# Patient Record
Sex: Female | Born: 1990 | Race: White | Hispanic: No | Marital: Married | Smoking: Current every day smoker
Health system: Southern US, Community
[De-identification: ages and names within clinical notes are randomized; demographics above are authoritative.]

## PROBLEM LIST (undated history)

## (undated) ENCOUNTER — Inpatient Hospital Stay: Payer: Self-pay

## (undated) DIAGNOSIS — D649 Anemia, unspecified: Secondary | ICD-10-CM

## (undated) DIAGNOSIS — F319 Bipolar disorder, unspecified: Secondary | ICD-10-CM

---

## 2014-11-22 NOTE — L&D Delivery Note (Signed)
Delivery Note At 9:01 PM a viable female was delivered via Vaginal, Spontaneous Delivery (Presentation: Left Occiput Anterior).  APGAR: Pending; weight Pending.   Placenta status: Intact, Spontaneous.  Cord: 3 vessels with the following complications: None  Anesthesia: Epidural  Episiotomy:  none Lacerations:  none Suture Repair: n/a Est. Blood Loss (mL):  250 mL  Mom to postpartum.  Baby to Couplet care / Skin to Skin.  Conard NovakJackson, Caide Campi D, MD 09/24/2015, 9:25 PM

## 2015-05-02 LAB — OB RESULTS CONSOLE RUBELLA ANTIBODY, IGM: Rubella: IMMUNE

## 2015-05-02 LAB — OB RESULTS CONSOLE RPR: RPR: NONREACTIVE

## 2015-05-02 LAB — OB RESULTS CONSOLE GC/CHLAMYDIA: Gonorrhea: NEGATIVE

## 2015-05-02 LAB — OB RESULTS CONSOLE HIV ANTIBODY (ROUTINE TESTING): HIV: NONREACTIVE

## 2015-05-02 LAB — OB RESULTS CONSOLE ABO/RH: RH TYPE: NEGATIVE

## 2015-05-02 LAB — OB RESULTS CONSOLE HEPATITIS B SURFACE ANTIGEN: HEP B S AG: NEGATIVE

## 2015-05-02 LAB — OB RESULTS CONSOLE VARICELLA ZOSTER ANTIBODY, IGG: VARICELLA IGG: IMMUNE

## 2015-08-22 LAB — OB RESULTS CONSOLE GC/CHLAMYDIA
Chlamydia: NEGATIVE
GC PROBE AMP, GENITAL: NEGATIVE

## 2015-08-22 LAB — OB RESULTS CONSOLE GBS: STREP GROUP B AG: NEGATIVE

## 2015-09-04 ENCOUNTER — Observation Stay
Admission: EM | Admit: 2015-09-04 | Discharge: 2015-09-04 | Disposition: A | Discharge status: Home / Self Care | Payer: Medicaid Other | Attending: Obstetrics & Gynecology | Admitting: Obstetrics & Gynecology

## 2015-09-04 ENCOUNTER — Encounter: Payer: Self-pay | Admitting: Obstetrics & Gynecology

## 2015-09-04 DIAGNOSIS — R32 Unspecified urinary incontinence: Secondary | ICD-10-CM | POA: Diagnosis not present

## 2015-09-04 DIAGNOSIS — O479 False labor, unspecified: Secondary | ICD-10-CM | POA: Diagnosis present

## 2015-09-04 DIAGNOSIS — O26893 Other specified pregnancy related conditions, third trimester: Secondary | ICD-10-CM | POA: Diagnosis not present

## 2015-09-04 DIAGNOSIS — N898 Other specified noninflammatory disorders of vagina: Secondary | ICD-10-CM | POA: Insufficient documentation

## 2015-09-04 MED ORDER — ACETAMINOPHEN 325 MG PO TABS: 650.0000 mg | ORAL_TABLET | ORAL | Status: DC | PRN

## 2015-09-04 MED ORDER — ONDANSETRON HCL 4 MG/2ML IJ SOLN: 4.0000 mg | Freq: Four times a day (QID) | INTRAMUSCULAR | Status: DC | PRN

## 2015-09-04 NOTE — Discharge Summary (Signed)
Physician Final Progress Note  Patient ID: Nancy Blankenship MRN: [id number] DOB/AGE: 25/06/1991 23 y.o.  Admit date: 09/04/2015 Admitting provider: Velora MediateHARRIS, Chinelo Benn. Discharge date: 09/04/2015  Admission Diagnoses: Vaginal discharge, urinary incontinence, third trimester  Discharge Diagnoses:  Active Problems:   * No active hospital problems. *  Same  Consults: None  Significant Findings/ Diagnostic Studies: neg study for amniotic fluid  Procedures: NST Reactive  Discharge Condition: good  Disposition: Final discharge disposition not confirmed  Diet: Regular diet  Discharge Activity: Activity as tolerated   A NST procedure was performed with FHR monitoring and a normal baseline established, appropriate time of 20-40 minutes of evaluation, and accels >2 seen w 15x15 characteristics.  Results show a REACTIVE NST.   Total time spent taking care of this patient: 15 minutes plus 20+ minute NST Discharge exam- No fever.    Abd NT, ND, gravid    Extr no edema    SVE closed cervix, neg nitrazine and pooling    Toco: no ctxs    FHT- R NST  Signed: Mirissa Lopresti PAUL 09/04/2015, 3:55 PM

## 2015-09-04 NOTE — OB Triage Note (Signed)
Recvd to LDR3 per wheelchair with c/o leaking fluid.  Changed to gown and to bed. EFM applied.  Plan of care discussed.  ORiented to room .  Verbalized understanding.

## 2015-09-04 NOTE — Plan of Care (Signed)
Discharge instructions given and explained.  Verbalized understanding.  Signed copy in hand and one on chart.

## 2015-09-23 ENCOUNTER — Inpatient Hospital Stay
Admission: EM | Admit: 2015-09-23 | Discharge: 2015-09-25 | DRG: 775 | Disposition: A | Discharge status: Home / Self Care | Payer: Medicaid Other | Attending: Obstetrics and Gynecology | Admitting: Obstetrics and Gynecology

## 2015-09-23 DIAGNOSIS — Z3A41 41 weeks gestation of pregnancy: Secondary | ICD-10-CM | POA: Diagnosis not present

## 2015-09-23 DIAGNOSIS — F319 Bipolar disorder, unspecified: Secondary | ICD-10-CM | POA: Diagnosis present

## 2015-09-23 DIAGNOSIS — W19XXXA Unspecified fall, initial encounter: Secondary | ICD-10-CM | POA: Diagnosis present

## 2015-09-23 DIAGNOSIS — O99344 Other mental disorders complicating childbirth: Principal | ICD-10-CM | POA: Diagnosis present

## 2015-09-23 DIAGNOSIS — Y92231 Patient bathroom in hospital as the place of occurrence of the external cause: Secondary | ICD-10-CM | POA: Diagnosis present

## 2015-09-23 DIAGNOSIS — Z8279 Family history of other congenital malformations, deformations and chromosomal abnormalities: Secondary | ICD-10-CM

## 2015-09-23 DIAGNOSIS — R55 Syncope and collapse: Secondary | ICD-10-CM | POA: Diagnosis present

## 2015-09-23 DIAGNOSIS — O480 Post-term pregnancy: Secondary | ICD-10-CM | POA: Diagnosis present

## 2015-09-23 DIAGNOSIS — F419 Anxiety disorder, unspecified: Secondary | ICD-10-CM | POA: Diagnosis present

## 2015-09-23 LAB — CBC
HCT: 33.5 % — ABNORMAL LOW (ref 35.0–47.0)
HEMOGLOBIN: 11.4 g/dL — AB (ref 12.0–16.0)
MCH: 29.1 pg (ref 26.0–34.0)
MCHC: 34 g/dL (ref 32.0–36.0)
MCV: 85.8 fL (ref 80.0–100.0)
Platelets: 167 10*3/uL (ref 150–440)
RBC: 3.91 MIL/uL (ref 3.80–5.20)
RDW: 15.2 % — ABNORMAL HIGH (ref 11.5–14.5)
WBC: 9.6 10*3/uL (ref 3.6–11.0)

## 2015-09-23 LAB — TYPE AND SCREEN
ABO/RH(D): O NEG
ANTIBODY SCREEN: NEGATIVE

## 2015-09-23 LAB — CHLAMYDIA/NGC RT PCR (ARMC ONLY)
CHLAMYDIA TR: NOT DETECTED
N gonorrhoeae: NOT DETECTED

## 2015-09-23 LAB — ABO/RH: ABO/RH(D): O NEG

## 2015-09-23 MED ORDER — MISOPROSTOL 25 MCG QUARTER TABLET
25.0000 ug | ORAL_TABLET | ORAL | Status: DC | PRN
  Filled 2015-09-23: qty 1

## 2015-09-23 MED ORDER — LIDOCAINE HCL (PF) 1 % IJ SOLN
INTRAMUSCULAR | Status: AC
  Filled 2015-09-23: qty 30

## 2015-09-23 MED ORDER — MISOPROSTOL 25 MCG QUARTER TABLET
25.0000 ug | ORAL_TABLET | ORAL | Status: DC | PRN
  Administered 2015-09-23 (×2): 25 ug via ORAL
  Filled 2015-09-23: qty 1

## 2015-09-23 MED ORDER — TERBUTALINE SULFATE 1 MG/ML IJ SOLN: 0.2500 mg | Freq: Once | INTRAMUSCULAR | Status: DC | PRN

## 2015-09-23 MED ORDER — LIDOCAINE HCL (PF) 1 % IJ SOLN
30.0000 mL | INTRAMUSCULAR | Status: DC | PRN
  Filled 2015-09-23: qty 30

## 2015-09-23 MED ORDER — OXYTOCIN 40 UNITS IN LACTATED RINGERS INFUSION - SIMPLE MED
62.5000 mL/h | INTRAVENOUS | Status: DC
  Administered 2015-09-24: 62.5 mL/h via INTRAVENOUS
  Filled 2015-09-23 (×2): qty 1000

## 2015-09-23 MED ORDER — LACTATED RINGERS IV SOLN
500.0000 mL | INTRAVENOUS | Status: DC | PRN
  Administered 2015-09-25: 1000 mL via INTRAVENOUS

## 2015-09-23 MED ORDER — BUTORPHANOL TARTRATE 1 MG/ML IJ SOLN
1.0000 mg | INTRAMUSCULAR | Status: DC | PRN
  Administered 2015-09-24 (×4): 1 mg via INTRAVENOUS
  Filled 2015-09-23 (×5): qty 1

## 2015-09-23 MED ORDER — MISOPROSTOL 200 MCG PO TABS
ORAL_TABLET | ORAL | Status: AC
  Administered 2015-09-23: 25 ug via ORAL
  Filled 2015-09-23: qty 4

## 2015-09-23 MED ORDER — CITRIC ACID-SODIUM CITRATE 334-500 MG/5ML PO SOLN
30.0000 mL | ORAL | Status: DC | PRN
  Filled 2015-09-23: qty 30

## 2015-09-23 MED ORDER — OXYTOCIN 10 UNIT/ML IJ SOLN
INTRAMUSCULAR | Status: AC
  Filled 2015-09-23: qty 2

## 2015-09-23 MED ORDER — ONDANSETRON HCL 4 MG/2ML IJ SOLN
4.0000 mg | Freq: Four times a day (QID) | INTRAMUSCULAR | Status: DC | PRN
  Administered 2015-09-24: 4 mg via INTRAVENOUS
  Filled 2015-09-23: qty 2

## 2015-09-23 MED ORDER — ACETAMINOPHEN 325 MG PO TABS: 650.0000 mg | ORAL_TABLET | ORAL | Status: DC | PRN

## 2015-09-23 MED ORDER — OXYTOCIN 40 UNITS IN LACTATED RINGERS INFUSION - SIMPLE MED
1.0000 m[IU]/min | INTRAVENOUS | Status: DC
  Administered 2015-09-23: 1 m[IU]/min via INTRAVENOUS

## 2015-09-23 MED ORDER — OXYTOCIN BOLUS FROM INFUSION
500.0000 mL | INTRAVENOUS | Status: DC
  Administered 2015-09-24: 500 mL via INTRAVENOUS

## 2015-09-23 MED ORDER — AMMONIA AROMATIC IN INHA
RESPIRATORY_TRACT | Status: DC
Start: 2015-09-23 — End: 2015-09-24
  Filled 2015-09-23: qty 10

## 2015-09-23 MED ORDER — LACTATED RINGERS IV SOLN
INTRAVENOUS | Status: DC
  Administered 2015-09-23: 20:00:00 via INTRAVENOUS

## 2015-09-23 NOTE — Plan of Care (Signed)
Second dose of cytotec 25 mcg given to pt orally. Pt sleeping soundly when nurse went into room. Explained to pt and pt 's husband reason for not checking cervix at present. ( very uncomfortable vaginal exam this am. Pt having occasional contractions).

## 2015-09-23 NOTE — H&P (Signed)
OB History & Physical   History of Present Illness:  Chief Complaint:   HPI:  Nancy Blankenship is a 24 y.o. G34P2002 female at 25.1 dated by LMP c/w 12w [place] with EDC of 09/16/15.  She presents to L&D for IOL due to past due date.  +FM, no CTX, no LOF, no VB  Pregnancy Issues: 1. Multiple moves this pregnancy, entry to care @ 20 weeks 2. Family history of Down Syndrome (niece) and Asperger Syndrome (Brother), patient declined [employer].  Genetic screening neg 3. History of Anxiety/Depression, Bipolar d/o. No meds this pregnancy 4. Rh neg, Anti-D pos @ screening but was s/p RhoGam @ 12 weeks and 28 weeks 5. Anemia  6. History of PPH with G2 7. Desires BTL  Maternal Medical History:  No past medical history on file.  No past surgical history on file.  No Known Allergies  Prior to Admission medications   Not on File     Prenatal care site: [facility]   Social History: She    Family History: family history is not on file.   Review of Systems: Negative x 10 systems reviewed except as noted in the HPI.     Physical Exam:  Vital Signs: BP 124/77 mmHg  Pulse 92  Temp(Src) 98 F (36.7 C)  General: no acute distress.  HEENT: normocephalic, atraumatic Heart: regular rate & rhythm.  No murmurs/rubs/gallops Lungs: clear to auscultation bilaterally, normal respiratory effort Abdomen: soft, gravid, non-tender;  EFW: 7.12 Pelvic:   External: Normal external female genitalia  Cervix: Dilation: 1 / Effacement (%): 20 / Station: -3    Extremities: non-tender, symmetric, 1+ edema bilaterally.  DTRs: 2+ Neurologic: Alert & oriented x 3.    No results found for this or any previous visit (from the past 24 hour(s)).  Pertinent Results:  Prenatal Labs: Blood type/Rh O neg  Antibody screen POS  (D) s/p Rhogam @ 12wks  Rubella Immune  Varicella Immune  RPR NR  HBsAg Neg  HIV NR  GC neg  Chlamydia neg  Genetic screening negative  1 hour GTT 143  3 hour GTT  74/162/135/111  GBS Neg 08/22/15   FHT:140 mod + accels no decels TOCO:  quiet SVE: 1/long/high   Cephalic by leopolds  Assessment:  Nancy Blankenship is a 24 y.o. G66P2002 female at 25.1 with IOL for past due date.   Plan:  1. Admit to Labor & Delivery 2. CBC, T&S, Clrs, IVF 3. GBS  neg 4. Consents obtained. 5. Continuous efm/toco 6. Start cervical ripening with cytotec buccally due to patient intolerance of pelvic exams 7. Have methergine, Hemabate, cytotec, and pitocin at bedside, with PPH kit.   8. No tubal consent on file, will discuss possible cost with patient and have her decide whether she wants this as inpatient now, or if in 6 weeks as outpatient.  TBD.  ----- Larey Days, MD Attending Obstetrician and Gynecologist [facility]

## 2015-09-23 NOTE — Progress Notes (Signed)
Pt turned to right side. Tracing maternal heart beat. Pt tearful. Pt talking to her mom that is taking care of her two sons.

## 2015-09-23 NOTE — Plan of Care (Signed)
Dr. Elesa MassedWard in to exam patient.  Cervix 3cm, posterior.  Difficult exam secondary to patient intolerance/discomfort.  Plan of care discussed with pt and husband regarding pain management plan, eating small meal/snack, then starting pitocin.  Patient and husband agreeable with plan.

## 2015-09-23 NOTE — Progress Notes (Signed)
Intrapartum progress note  S:  Patient comfortable, not really feeling contractions.  No complaints.  No HA SOB RUQ pain or vision changes O: BP 127/74 mmHg  Pulse 78  Temp(Src) 97.7 F (36.5 C) (Oral)  Resp 20    FHT:L 130 mod + accels no decels. Toco: q2-3 mins SVE: 3/70/-3 stripped membranes, cephalic -  Still very very uncomfortable.  A/P: 23yo [id number] @ 41.4 with IOL for past due date 1.  IUP: category 1 2. IOL: s/p 2 doses of buccal cytotec 25mcg.  Bishop now favorable will start pitocin.  Ok for patient to have small meal before this. 3. Dispo: continue active management, remain inpatient. 4. Recommend earlier epidural to facilitate cervical checks PRN.  ----- Ranae Plumberhelsea Xian Apostol, MD Attending Obstetrician and Gynecologist [facility]

## 2015-09-23 NOTE — Progress Notes (Signed)
Pt sitting up eating clear liquid.

## 2015-09-23 NOTE — Plan of Care (Signed)
Pt c/o sternal chest pain. Pulse ox monitor applied. Dr ward notified.

## 2015-09-24 ENCOUNTER — Inpatient Hospital Stay: Payer: Medicaid Other | Admitting: Anesthesiology

## 2015-09-24 ENCOUNTER — Encounter: Payer: Self-pay | Admitting: *Deleted

## 2015-09-24 LAB — RPR: RPR: NONREACTIVE

## 2015-09-24 LAB — PLATELET COUNT: PLATELETS: 166 10*3/uL (ref 150–440)

## 2015-09-24 MED ORDER — FENTANYL 2.5 MCG/ML W/ROPIVACAINE 0.2% IN NS 100 ML EPIDURAL INFUSION (ARMC-ANES)
EPIDURAL | Status: DC | PRN
  Administered 2015-09-24: 9 mL/h via EPIDURAL

## 2015-09-24 MED ORDER — LIDOCAINE-EPINEPHRINE (PF) 1.5 %-1:200000 IJ SOLN
INTRAMUSCULAR | Status: DC | PRN
  Administered 2015-09-24: 3 mL via EPIDURAL

## 2015-09-24 MED ORDER — IBUPROFEN 600 MG PO TABS
ORAL_TABLET | ORAL | Status: AC
  Filled 2015-09-24: qty 1

## 2015-09-24 MED ORDER — BUPIVACAINE HCL (PF) 0.25 % IJ SOLN
INTRAMUSCULAR | Status: DC | PRN
  Administered 2015-09-24: 5 mL via EPIDURAL
  Administered 2015-09-24: 3 mL via EPIDURAL

## 2015-09-24 MED ORDER — OXYCODONE-ACETAMINOPHEN 5-325 MG PO TABS
ORAL_TABLET | ORAL | Status: AC
  Filled 2015-09-24: qty 1

## 2015-09-24 MED ORDER — IBUPROFEN 600 MG PO TABS
600.0000 mg | ORAL_TABLET | Freq: Four times a day (QID) | ORAL | Status: DC
  Administered 2015-09-24 – 2015-09-25 (×4): 600 mg via ORAL
  Filled 2015-09-24 (×2): qty 1

## 2015-09-24 MED ORDER — FENTANYL 2.5 MCG/ML W/ROPIVACAINE 0.2% IN NS 100 ML EPIDURAL INFUSION (ARMC-ANES)
EPIDURAL | Status: AC
  Filled 2015-09-24: qty 100

## 2015-09-24 MED ORDER — OXYCODONE-ACETAMINOPHEN 5-325 MG PO TABS: 1.0000 | ORAL_TABLET | ORAL | Status: DC | PRN

## 2015-09-24 NOTE — Discharge Summary (Signed)
OB Discharge Summary  Patient Name: Nancy Blankenship DOB: [birth year] MRN: [id number]  Date of admission: 09/23/2015 Delivering MD: Thomasene Mohair, MD Date of Delivery: 09/24/2015  Date of discharge: 09/25/2015  Admitting diagnosis: 41 WEEKS INDUCTION  Intrauterine pregnancy: [redacted]w[redacted]d Secondary diagnosis:  Rh negative, bipolar, depression/anxiety     Discharge diagnosis: Same. Term Pregnancy Delivered                                                                                                Post partum procedures:none  Augmentation: AROM and Pitocin  Complications: None  Hospital course:  Induction of Labor With Vaginal Delivery   24 y.o. yo G3P3003 at [redacted]w[redacted]d was admitted to the hospital 09/23/2015 for induction of labor.  Indication for induction: Postdates.  Patient had an uncomplicated labor course as follows: Membrane Rupture Time/Date: 7:25 PM ,09/24/2015   Intrapartum Procedures: Episiotomy:   none                                        Lacerations:    none Patient had delivery of a Viable infant.  Information for the patient's newborn:  Nancy, Blankenship [[id number]]  Delivery Method: Vag-Spont    09/24/2015  Details of delivery can be found in separate delivery note.  Patient had syncopal event after delivery and H/H downtrended but no obvious VB or lochia with VV syncope as most likely etiology. Patient is discharged home on PPD#1 after desiring early d/c and meeting all PP goals.     Physical exam  Filed Vitals:   09/25/15 1312 09/25/15 1628 09/25/15 1900 09/25/15 1901  BP: 122/67 126/71 111/64 111/64  Pulse: 69 71 74 74  Temp: 98.2 F (36.8 C) 97.8 F (36.6 C) 98.3 F (36.8 C) 98.3 F (36.8 C)  TempSrc: Oral Oral Oral Oral  Resp: Height:     (1.702 m)  Weight:    181 lb (82.101 kg)  SpO2: 100%      General: alert Lochia: appropriate Uterine Fundus: firm below the umbilicus Incision: N/A DVT Evaluation: No evidence of DVT seen on  physical exam.  Labs: Lab Results  Component Value Date   WBC 11.9* 09/25/2015   HGB 8.8* 09/25/2015   HCT 26.2* 09/25/2015   MCV 86.1 09/25/2015   PLT 169 09/25/2015   CMP Latest Ref Rng 09/25/2015  Glucose 65 - 99 mg/dL 782(N)  BUN 6 - 20 mg/dL 6  Creatinine 5.62 - 1.30 mg/dL 8.65  Sodium 784 - 696 mmol/L 136  Potassium 3.5 - 5.1 mmol/L 3.4(L)  Chloride 101 - 111 mmol/L 110  CO2 22 - 32 mmol/L 24  Calcium 8.9 - 10.3 mg/dL 7.7(L)  Total Protein 6.5 - 8.1 g/dL 4.3(L)  Total Bilirubin 0.3 - 1.2 mg/dL 0.4  Alkaline Phos 38 - 126 U/L 149(H)  AST 15 - 41 U/L 18  ALT 14 - 54 U/L 9(L)    Discharge instruction: per After Visit Summary.  Medications:    Medication List  TAKE these medications        docusate sodium 100 MG capsule  Commonly known as:  COLACE  Take 1 capsule (100 mg total) by mouth 2 (two) times daily.     ferrous sulfate 325 (65 FE) MG tablet  Take 1 tablet (325 mg total) by mouth 2 (two) times daily with a meal.     ibuprofen 600 MG tablet  Commonly known as:  ADVIL,MOTRIN  Take 1 tablet (600 mg total) by mouth every 6 (six) hours as needed for mild pain or cramping.  Start taking on:  09/26/2015     oxyCODONE-acetaminophen 5-325 MG tablet  Commonly known as:  PERCOCET/ROXICET  Take 1 tablet by mouth every 6 (six) hours as needed for severe pain.        Diet: routine diet  Activity: Advance as tolerated. Pelvic rest for 6 weeks.   Outpatient follow up: 1wk PP mood check with Dr. Jean RosenthalJackson Postpartum contraception: Pt unable to get ppBTL b/c of insurance and didn't sign paperwork.Undecided about bridge Rhogam Given postpartum: yes Rubella vaccine given postpartum: no Varicella vaccine given postpartum: no TDaP given antepartum or postpartum: TDaP given 06/27/15 Flu vaccine given antepartum or postpartum: yes  Newborn Data: Live born female Birth Weight:  3840 pending APGAR: , 8/9    Baby Feeding: formula  Disposition:home with  mother  Cornelia Copaharlie Louie Flenner, Jr MD [facility]  Pager: [phone]

## 2015-09-24 NOTE — Progress Notes (Signed)
L&D Note    Subjective:  Pain fairly well controlled with stadol  Objective:   Filed Vitals:   09/24/15 0347 09/24/15 0516 09/24/15 0620 09/24/15 0733  BP: 125/84 129/86 126/83 122/78  Pulse: 62 64 60 60  Temp: 97.9 F (36.6 C)   98 F (36.7 C)  TempSrc: Oral   Oral  Resp: 18 20 18 16     Current Vital Signs 24h Vital Sign Ranges  T 98 F (36.7 C) Temp  Avg: 97.9 F (36.6 C)  Min: 97.7 F (36.5 C)  Max: 98 F (36.7 C)  BP 122/78 mmHg BP  Min: 102/50  Max: 147/83  HR 60 Pulse  Avg: 68.3  Min: 56  Max: 92  RR 16 Resp  Avg: 18.3  Min: 16  Max: 20  SaO2     No Data Recorded      Gen: NAD FHR: 130/mod var/+accels/no decels Toco: 4 q 10 min SVE: 3-4 (could not complete exam due to patient discomfort)/60/high  Medications SCHEDULED MEDICATIONS     MEDICATION INFUSIONS  . lactated ringers 125 mL/hr at 09/23/15 1930  . oxytocin 40 units in LR 1000 mL    . oxytocin 40 units in LR 1000 mL    . oxytocin 40 units in LR 1000 mL 13 milli-units/min (09/24/15 0850)    PRN MEDICATIONS  acetaminophen, butorphanol, citric acid-sodium citrate, lactated ringers, lidocaine (PF), misoprostol, ondansetron, terbutaline   Assessment & Plan:  24 y.o. [id number] at 5131w2d IOL for dates *Labor: continue pitocin. AROM when vertex engaged. *Fetal Well-being: reassuring, category 1 *GBS:neg *Analgesia: iv prn. Epidural whenever desired  Conard NovakJackson, Jonise Weightman D, MD  09/24/2015 8:51 AM  [facility]

## 2015-09-24 NOTE — Progress Notes (Signed)
Patient expressing frustration that she has been here for "24 hours" and still has not delivered. Reassured patient and husband that everything is progressing appropriately. Showing frustration about not having AROM at this point. Explained the necessity of baby being engaged in pelvis before AROM is appropriate.

## 2015-09-24 NOTE — Anesthesia Preprocedure Evaluation (Signed)
Anesthesia Evaluation  Patient identified by MRN, date of birth, ID band Patient awake    Reviewed: Allergy & Precautions, H&P , Patient's Chart, lab work & pertinent test results  History of Anesthesia Complications Negative for: history of anesthetic complications  Airway Mallampati: II   Neck ROM: full    Dental no notable dental hx. (+) Teeth Intact   Pulmonary neg pulmonary ROS,    Pulmonary exam normal        Cardiovascular negative cardio ROS Normal cardiovascular exam     Neuro/Psych    GI/Hepatic   Endo/Other    Renal/GU      Musculoskeletal   Abdominal   Peds  Hematology   Anesthesia Other Findings   Reproductive/Obstetrics (+) Pregnancy                             Anesthesia Physical Anesthesia Plan  ASA: II  Anesthesia Plan: Epidural   Post-op Pain Management:    Induction:   Airway Management Planned:   Additional Equipment:   Intra-op Plan:   Post-operative Plan:   Informed Consent: I have reviewed the patients History and Physical, chart, labs and discussed the procedure including the risks, benefits and alternatives for the proposed anesthesia with the patient or authorized representative who has indicated his/her understanding and acceptance.     Plan Discussed with: Anesthesiologist  Anesthesia Plan Comments:         Anesthesia Quick Evaluation

## 2015-09-24 NOTE — Progress Notes (Signed)
Patient ID: Nancy Blankenship, female   DOB: 01/06/1991, 24 y.o.   MRN: [id number] L&D Note    Subjective:  Patient with epidural. Still with some pressure issues in her abdomen, but getting better with epidural dosing adjustments per anesthesia.  Objective:   Filed Vitals:   09/24/15 1816 09/24/15 1818 09/24/15 1821 09/24/15 1831  BP:  108/67    Pulse:  57    Temp:      TempSrc:      Resp:      SpO2: 100%  100% 100%    Current Vital Signs 24h Vital Sign Ranges  T 98.1 F (36.7 C) Temp  Avg: 98 F (36.7 C)  Min: 97.7 F (36.5 C)  Max: 98.2 F (36.8 C)  BP 108/67 mmHg BP  Min: 105/78  Max: 136/86  HR (!) 57 Pulse  Avg: 64.8  Min: 56  Max: 84  RR 16 Resp  Avg: 18  Min: 16  Max: 20  SaO2 100 %   SpO2  Avg: 99.3 %  Min: 95 %  Max: 100 %      Gen: NAD FHR: 135/mod var/+accels/no decels Toco: 4 q 10 min SVE: 4/75/-2, AROM clear fluid  Medications SCHEDULED MEDICATIONS  . fentanyl 2.5 mcg/ml w/ropivacaine 0.2% in normal saline 100 mL EPIDURAL Infusion        MEDICATION INFUSIONS  . lactated ringers 125 mL/hr at 09/23/15 1930  . oxytocin 40 units in LR 1000 mL    . oxytocin 40 units in LR 1000 mL    . oxytocin 40 units in LR 1000 mL 20 milli-units/min (09/24/15 1528)    PRN MEDICATIONS  acetaminophen, butorphanol, citric acid-sodium citrate, lactated ringers, lidocaine (PF), misoprostol, ondansetron, terbutaline   Assessment & Plan:  24 y.o. [id number] at 4162w2d IOL for dates *Labor: AROM for clear fluid.  Reduce pitocin dose to 10 from 20.  *Fetal Well-being: reassuring, category 1 *GBS:neg *Analgesia: epidural * Foley catheter placed by me in sterile fashion prior to cervical check.  Conard NovakJackson, Stephen D, MD  09/24/2015 7:37 PM  [facility]

## 2015-09-24 NOTE — Anesthesia Procedure Notes (Signed)
Epidural  Start time: 09/24/2015 5:15 PM End time: 09/24/2015 5:40 PM  Staffing Resident/CRNA: Bertis Hustead Performed by: resident/CRNA   Preanesthetic Checklist Completed: patient identified, site marked, surgical consent, pre-op evaluation, IV checked, risks and benefits discussed and monitors and equipment checked  Epidural Patient position: sitting Prep: Betadine Patient monitoring: heart rate, continuous pulse ox and blood pressure Approach: midline Location: L3-L4 Injection technique: LOR air  Needle:  Needle type: Tuohy  Needle gauge: 18 G Needle length: 9 cm Needle insertion depth: 5 cm Catheter type: closed end flexible Catheter size: 20 Guage Catheter at skin depth: 10 cm Test dose: negative and 1.5% lidocaine with Epi 1:200 K  Assessment Events: blood not aspirated, injection not painful, no injection resistance, negative IV test and no paresthesia  Additional Notes Reason for block:procedure for pain

## 2015-09-25 ENCOUNTER — Other Ambulatory Visit: Payer: Self-pay | Admitting: Obstetrics and Gynecology

## 2015-09-25 LAB — CBC
HCT: 25.5 % — ABNORMAL LOW (ref 35.0–47.0)
HEMATOCRIT: 26.2 % — AB (ref 35.0–47.0)
HEMOGLOBIN: 8.8 g/dL — AB (ref 12.0–16.0)
Hemoglobin: 8.4 g/dL — ABNORMAL LOW (ref 12.0–16.0)
MCH: 28.6 pg (ref 26.0–34.0)
MCH: 29 pg (ref 26.0–34.0)
MCHC: 33 g/dL (ref 32.0–36.0)
MCHC: 33.7 g/dL (ref 32.0–36.0)
MCV: 86.5 fL (ref 80.0–100.0)
MCV: 86.1 fL (ref 80.0–100.0)
PLATELETS: 153 10*3/uL (ref 150–440)
Platelets: 169 10*3/uL (ref 150–440)
RBC: 2.95 MIL/uL — ABNORMAL LOW (ref 3.80–5.20)
RBC: 3.04 MIL/uL — ABNORMAL LOW (ref 3.80–5.20)
RDW: 14.9 % — AB (ref 11.5–14.5)
RDW: 15.6 % — AB (ref 11.5–14.5)
WBC: 13 10*3/uL — AB (ref 3.6–11.0)
WBC: 11.9 10*3/uL — AB (ref 3.6–11.0)

## 2015-09-25 LAB — COMPREHENSIVE METABOLIC PANEL
ALBUMIN: 2.1 g/dL — AB (ref 3.5–5.0)
ALT: 9 U/L — ABNORMAL LOW (ref 14–54)
ANION GAP: 2 — AB (ref 5–15)
AST: 18 U/L (ref 15–41)
Alkaline Phosphatase: 149 U/L — ABNORMAL HIGH (ref 38–126)
BUN: 6 mg/dL (ref 6–20)
CHLORIDE: 110 mmol/L (ref 101–111)
CO2: 24 mmol/L (ref 22–32)
Calcium: 7.7 mg/dL — ABNORMAL LOW (ref 8.9–10.3)
Creatinine, Ser: 0.77 mg/dL (ref 0.44–1.00)
GFR calc Af Amer: >60 mL/min (ref >60)
Glucose, Bld: 124 mg/dL — ABNORMAL HIGH (ref 65–99)
POTASSIUM: 3.4 mmol/L — AB (ref 3.5–5.1)
Sodium: 136 mmol/L (ref 135–145)
Total Bilirubin: 0.4 mg/dL (ref 0.3–1.2)
Total Protein: 4.3 g/dL — ABNORMAL LOW (ref 6.5–8.1)

## 2015-09-25 MED ORDER — PRENATAL MULTIVITAMIN CH
1.0000 | ORAL_TABLET | Freq: Every day | ORAL | Status: DC
  Administered 2015-09-25: 1 via ORAL
  Filled 2015-09-25: qty 1

## 2015-09-25 MED ORDER — WITCH HAZEL-GLYCERIN EX PADS: 1.0000 "application " | MEDICATED_PAD | CUTANEOUS | Status: DC | PRN

## 2015-09-25 MED ORDER — SODIUM CHLORIDE 0.9 % IJ SOLN
INTRAMUSCULAR | Status: AC
  Filled 2015-09-25: qty 3

## 2015-09-25 MED ORDER — OXYCODONE-ACETAMINOPHEN 5-325 MG PO TABS
2.0000 | ORAL_TABLET | ORAL | Status: DC | PRN
  Administered 2015-09-25 (×4): 2 via ORAL
  Filled 2015-09-25 (×4): qty 2

## 2015-09-25 MED ORDER — SIMETHICONE 80 MG PO CHEW: 80.0000 mg | CHEWABLE_TABLET | ORAL | Status: DC | PRN

## 2015-09-25 MED ORDER — FERROUS SULFATE 325 (65 FE) MG PO TABS: 325.0000 mg | ORAL_TABLET | Freq: Two times a day (BID) | ORAL | Status: AC

## 2015-09-25 MED ORDER — BENZOCAINE-MENTHOL 20-0.5 % EX AERO: 1.0000 "application " | INHALATION_SPRAY | CUTANEOUS | Status: DC | PRN

## 2015-09-25 MED ORDER — DOCUSATE SODIUM 100 MG PO CAPS: 100.0000 mg | ORAL_CAPSULE | Freq: Two times a day (BID) | ORAL | Status: DC

## 2015-09-25 MED ORDER — FAMOTIDINE 20 MG PO TABS: 40.0000 mg | ORAL_TABLET | ORAL | Status: DC

## 2015-09-25 MED ORDER — OXYCODONE-ACETAMINOPHEN 5-325 MG PO TABS: 1.0000 | ORAL_TABLET | Freq: Four times a day (QID) | ORAL | Status: DC | PRN

## 2015-09-25 MED ORDER — LANOLIN HYDROUS EX OINT: TOPICAL_OINTMENT | CUTANEOUS | Status: DC | PRN

## 2015-09-25 MED ORDER — SENNOSIDES-DOCUSATE SODIUM 8.6-50 MG PO TABS
2.0000 | ORAL_TABLET | ORAL | Status: DC
  Administered 2015-09-25: 2 via ORAL
  Filled 2015-09-25: qty 2

## 2015-09-25 MED ORDER — RHO D IMMUNE GLOBULIN 1500 UNIT/2ML IJ SOSY
300.0000 ug | PREFILLED_SYRINGE | Freq: Once | INTRAMUSCULAR | Status: AC
  Administered 2015-09-25: 300 ug via INTRAMUSCULAR
  Filled 2015-09-25: qty 2

## 2015-09-25 MED ORDER — OXYTOCIN 40 UNITS IN LACTATED RINGERS INFUSION - SIMPLE MED: 62.5000 mL/h | INTRAVENOUS | Status: DC | PRN

## 2015-09-25 MED ORDER — AMMONIA AROMATIC IN INHA
RESPIRATORY_TRACT | Status: AC
  Filled 2015-09-25: qty 10

## 2015-09-25 MED ORDER — ONDANSETRON HCL 4 MG PO TABS: 4.0000 mg | ORAL_TABLET | ORAL | Status: DC | PRN

## 2015-09-25 MED ORDER — ONDANSETRON HCL 4 MG/2ML IJ SOLN: 4.0000 mg | INTRAMUSCULAR | Status: DC | PRN

## 2015-09-25 MED ORDER — DIPHENHYDRAMINE HCL 25 MG PO CAPS: 25.0000 mg | ORAL_CAPSULE | Freq: Four times a day (QID) | ORAL | Status: DC | PRN

## 2015-09-25 MED ORDER — IBUPROFEN 600 MG PO TABS: 600.0000 mg | ORAL_TABLET | Freq: Four times a day (QID) | ORAL | Status: DC | PRN

## 2015-09-25 MED ORDER — ACETAMINOPHEN 325 MG PO TABS: 650.0000 mg | ORAL_TABLET | ORAL | Status: DC | PRN

## 2015-09-25 MED ORDER — IBUPROFEN 600 MG PO TABS: 600.0000 mg | ORAL_TABLET | Freq: Four times a day (QID) | ORAL | Status: DC

## 2015-09-25 MED ORDER — FERROUS SULFATE 325 (65 FE) MG PO TABS
325.0000 mg | ORAL_TABLET | Freq: Two times a day (BID) | ORAL | Status: DC
  Administered 2015-09-25 (×2): 325 mg via ORAL
  Filled 2015-09-25 (×2): qty 1

## 2015-09-25 MED ORDER — DIBUCAINE 1 % RE OINT: 1.0000 "application " | TOPICAL_OINTMENT | RECTAL | Status: DC | PRN

## 2015-09-25 MED ORDER — LACTATED RINGERS IV SOLN: INTRAVENOUS | Status: DC

## 2015-09-25 NOTE — Anesthesia Postprocedure Evaluation (Signed)
  Anesthesia Post-op Note  Patient: Nancy Blankenship  Procedure(s) Performed: * No procedures listed *  Anesthesia type:Epidural  Patient location: Floor  Post pain: Pain level controlled  Post assessment: Post-op Vital signs reviewed, Patient's Cardiovascular Status Stable, Respiratory Function Stable, Patent Airway and No signs of Nausea or vomiting  Post vital signs: Reviewed and stable  Last Vitals:  Filed Vitals:   09/25/15 0416  BP: 131/86  Pulse: 67  Temp:   Resp:     Level of consciousness: awake, alert  and patient cooperative  Complications: No apparent anesthesia complications

## 2015-09-25 NOTE — Progress Notes (Signed)
Recent Labs     09/23/15  1230  09/25/15  0110  HGB  11.4*  8.4*  HCT  33.5*  25.5*    Greater than expected drop in blood counts given blood loss with delivery.  Her fundus is firm and no other bleeding episodes.   Vitals stable.  Will continue to monitor closely.  Repeat CBC later this morning or PRN.  Thomasene MohairStephen Markavious Micco, MD 09/25/2015 1:55 AM

## 2015-09-25 NOTE — Discharge Summary (Signed)
Dr. Vergie LivingPickens in to dc pt to home at 2245 per pt request. Rhogam given IM. Pt had already received tDap and Flu shots. Prescriptions given to pt, discharge reviewed and signed. Pt dc'd to home with infant in arms via w/c by CNA at 2310.

## 2015-09-25 NOTE — Progress Notes (Addendum)
Patient ID: Nancy Blankenship, female   DOB: 09/13/1991, 24 y.o.   MRN: [id number] Subjective: called to see patient after patient collapsed in bathroom after urinating.  She fell and was caught by [employer].  She was placed in bed in Trendelenburg position, given oxygen, and an IV fluid bolus was started. She slowly has recovered. She denies any current symptoms.  Objective: BP 111/66, P 60, O2 sats 100% on oxygen GEN: somewhat lethargic in speech and slurring her response Neuro: oriented x 3, alert though she is somewhat sluggish CV: RRR Pulm: CTAB Abd; soft, mildly ttp, fundus firm at umb Ext: no e/c/t  A/P: 24 y.o. female who is 3 hours postpartum from SVD, who had a syncopal episode.  Based on vital signs and description of event, is likely vasovagal. - CBC, CMP - IVF bolus 1,04200mL LR - Continue to monitor

## 2015-09-25 NOTE — Progress Notes (Addendum)
Daily Post Partum Note  Nancy Blankenship is a 24 y.o. [id number] PPD#1 s/p  SVD/intact perineum  @ 7528w2d.  Pregnancy c/b anxiety/depression, bipolar, [facility], Rh negative  24hr/overnight events:  After delivery, patient had syncopal event and had drop of H/H (no increased lochia/VB though)  Subjective:  Normal and decreasing lochia, no s/s of anemia  Objective:   Filed Vitals:   09/25/15 1312 09/25/15 1628 09/25/15 1900 09/25/15 1901  BP: 122/67 126/71 111/64 111/64  Pulse: 69 71 74 74  Temp: 98.2 F (36.8 C) 97.8 F (36.6 C) 98.3 F (36.8 C) 98.3 F (36.8 C)  TempSrc: Oral Oral Oral Oral  Resp: 17 18 18 18   Height:    5\' 7"  (1.702 m)  Weight:    181 lb (82.101 kg)  SpO2: 100%        Current Vital Signs 24h Vital Sign Ranges  T 98.3 F (36.8 C) Temp  Avg: 98.2 F (36.8 C)  Min: 97.8 F (36.6 C)  Max: 98.3 F (36.8 C)  BP 111/64 mmHg BP  Min: 61/40  Max: 141/87  HR 74 Pulse  Avg: 70.4  Min: 54  Max: 88  RR 18 Resp  Avg: 16.5  Min: 12  Max: 18  SaO2 100 % RA SpO2  Avg: 98.4 %  Min: 91 %  Max: 100 %       24 Hour I/O Current Shift I/O  Time Ins Outs 11/02 0701 - 11/03 0700 In: 2432.4 [I.V.:2432.4] Out: 300 [Urine:300]    Pt had one mild range BP  General: NAD Abdomen: FF below the umbilicus, nttp, nd Perineum: deferred Skin:  Warm and dry.  Cardiovascular:Regular rate and rhythm. Respiratory:  Clear to auscultation bilateral. Normal respiratory effort Extremities: no c/c/e  Medications Current Facility-Administered Medications  Medication Dose Route Frequency Provider Last Rate Last Dose  . acetaminophen (TYLENOL) tablet 650 mg  650 mg Oral Q4H PRN Conard NovakStephen D Jackson, MD      . benzocaine-Menthol (DERMOPLAST) 20-0.5 % topical spray 1 application  1 application Topical PRN Conard NovakStephen D Jackson, MD      . witch hazel-glycerin (TUCKS) pad 1 application  1 application Topical PRN Conard NovakStephen D Jackson, MD       And  . dibucaine (NUPERCAINAL) 1 % rectal ointment 1  application  1 application Rectal PRN Conard NovakStephen D Jackson, MD      . diphenhydrAMINE (BENADRYL) capsule 25 mg  25 mg Oral Q6H PRN Conard NovakStephen D Jackson, MD      . ferrous sulfate tablet 325 mg  325 mg Oral BID WC Conard NovakStephen D Jackson, MD   325 mg at 09/25/15 2015  . [START ON 09/26/2015] ibuprofen (ADVIL,MOTRIN) tablet 600 mg  600 mg Oral 4 times per day Conard NovakStephen D Jackson, MD      . lanolin ointment   Topical PRN Conard NovakStephen D Jackson, MD      . ondansetron [facility](ZOFRAN) tablet 4 mg  4 mg Oral Q4H PRN Conard NovakStephen D Jackson, MD      . oxyCODONE-acetaminophen (PERCOCET/ROXICET) 5-325 MG per tablet 1 tablet  1 tablet Oral Q4H PRN Conard NovakStephen D Jackson, MD      . oxyCODONE-acetaminophen (PERCOCET/ROXICET) 5-325 MG per tablet 2 tablet  2 tablet Oral Q4H PRN Conard NovakStephen D Jackson, MD   2 tablet at 09/25/15 2115  . prenatal multivitamin tablet 1 tablet  1 tablet Oral Q1200 Conard NovakStephen D Jackson, MD   1 tablet at 09/25/15 1106  . rho (d) immune globulin (RHIG/RHOPHYLAC)  injection 300 mcg  300 mcg Intramuscular Once [place] Bing, MD      . senna-docusate (Senokot-S) tablet 2 tablet  2 tablet Oral Q24H Conard Novak, MD   2 tablet at 09/25/15 1106  . simethicone (MYLICON) chewable tablet 80 mg  80 mg Oral PRN Conard Novak, MD         Recent Labs Lab 09/23/15 1230 09/24/15 1626 09/25/15 0110 09/25/15 0527  WBC 9.6  --  13.0* 11.9*  HGB 11.4*  --  8.4* 8.8*  HCT 33.5*  --  25.5* 26.2*  PLT 167 166 153 169    Recent Labs Lab 09/25/15 0110  NA 136  K 3.4*  CL 110  CO2 24  BUN 6  CREATININE 0.77  GLUCOSE 124*  CALCIUM 7.7*    Assessment & Plan:  Pt doing and desiring d/c home tonight *Postpartum/postop: routine care *Rh negative: RN to give rhogam prior to discharge (infant is O pos) *Psych: mood normal -recommend 1wk mood check *Dispo: home tonight  O NEG / Rubella Immune / Varicella Immune/  RPR negative / HIV negative / HepBsAg negative / Tdap UTD: Yes/pap neg 2016 (no EC/TZ seen) / Bottle  /  Contraception: still desiring BTL and pt told that couldn't do during hospital stay due to insurance anyways. unsure of bridge. / Follow up: [facility] 1wk for mood check  Cornelia Copa. MD [facility] Pager [phone]

## 2016-09-05 ENCOUNTER — Encounter: Payer: Self-pay | Admitting: Gynecology

## 2016-09-05 ENCOUNTER — Ambulatory Visit
Admission: EM | Admit: 2016-09-05 | Discharge: 2016-09-05 | Disposition: A | Discharge status: Home / Self Care | Payer: Medicaid Other | Attending: Family Medicine | Admitting: Family Medicine

## 2016-09-05 DIAGNOSIS — Z885 Allergy status to narcotic agent status: Secondary | ICD-10-CM | POA: Insufficient documentation

## 2016-09-05 DIAGNOSIS — R05 Cough: Secondary | ICD-10-CM | POA: Diagnosis present

## 2016-09-05 DIAGNOSIS — B9789 Other viral agents as the cause of diseases classified elsewhere: Secondary | ICD-10-CM

## 2016-09-05 DIAGNOSIS — J069 Acute upper respiratory infection, unspecified: Secondary | ICD-10-CM | POA: Diagnosis not present

## 2016-09-05 LAB — RAPID STREP SCREEN (MED CTR MEBANE ONLY): Streptococcus, Group A Screen (Direct): NEGATIVE

## 2016-09-05 NOTE — ED Provider Notes (Signed)
[facility]    CSN: [id number] Arrival date & time: 09/05/16  [id number]     History   Chief Complaint Chief Complaint  Patient presents with  . Cough    HPI Nancy Blankenship is a 25 y.o. female.    URI  Presenting symptoms: congestion, cough, rhinorrhea and sore throat   Severity:  Moderate Onset quality:  Sudden Duration:  3 days Timing:  Constant Progression:  Unchanged Chronicity:  New Relieved by:  Nothing Ineffective treatments:  OTC medications Associated symptoms: no wheezing   Risk factors: sick contacts   Risk factors: not elderly, no chronic cardiac disease, no chronic kidney disease, no chronic respiratory disease, no diabetes mellitus, no immunosuppression, no recent illness and no recent travel     History reviewed. No pertinent past medical history.  Patient Active Problem List   Diagnosis Date Noted  . Labor and delivery indication for care or intervention 09/23/2015  . Irregular contractions 09/04/2015    History reviewed. No pertinent surgical history.  OB History    Gravida Para Term Preterm AB Living   3 3 3  0 0 0   SAB TAB Ectopic Multiple Live Births   0 0 0 0         Home Medications    Prior to Admission medications   Medication Sig Start Date End Date Taking? Authorizing Provider  docusate sodium (COLACE) 100 MG capsule Take 1 capsule (100 mg total) by mouth 2 (two) times daily. 09/25/15   [place] Bingharlie Pickens, MD  ferrous sulfate 325 (65 FE) MG tablet Take 1 tablet (325 mg total) by mouth 2 (two) times daily with a meal. 09/25/15   [place] Bingharlie Pickens, MD  ibuprofen (ADVIL,MOTRIN) 600 MG tablet Take 1 tablet (600 mg total) by mouth every 6 (six) hours as needed for mild pain or cramping. 09/26/15   [place] Bingharlie Pickens, MD  oxyCODONE-acetaminophen (PERCOCET/ROXICET) 5-325 MG tablet Take 1 tablet by mouth every 6 (six) hours as needed for severe pain. 09/25/15   [place] Bingharlie Pickens, MD    Family History No family history on  file.  Social History Social History  Substance Use Topics  . Smoking status: Never Smoker  . Smokeless tobacco: Never Used  . Alcohol use No     Allergies   Codeine sulfate and Nyquil multi-symptom [pseudoeph-doxylamine-dm-apap]   Review of Systems Review of Systems  HENT: Positive for congestion, rhinorrhea and sore throat.   Respiratory: Positive for cough. Negative for wheezing.      Physical Exam Triage Vital Signs ED Triage Vitals  Enc Vitals Group     BP 09/05/16 0922 109/75     Pulse Rate 09/05/16 0922 77     Resp 09/05/16 0922 16     Temp 09/05/16 0922 98.6 F (37 C)     Temp Source 09/05/16 0922 Oral     SpO2 09/05/16 0922 98 %     Weight 09/05/16 0924 162 lb (73.5 kg)     Height 09/05/16 0924 5\' 7"  (1.702 m)     Head Circumference --      Peak Flow --      Pain Score 09/05/16 0929 5     Pain Loc --      Pain Edu? --      Excl. in GC? --    No data found.   Updated Vital Signs BP 109/75 (BP Location: Left Arm)   Pulse 77   Temp 98.6 F (37 C) (Oral)   Resp 16  Ht 5\' 7"  (1.702 m)   Wt 162 lb (73.5 kg)   LMP 08/13/2016   SpO2 98%   BMI 25.37 kg/m   Visual Acuity Right Eye Distance:   Left Eye Distance:   Bilateral Distance:    Right Eye Near:   Left Eye Near:    Bilateral Near:     Physical Exam  Constitutional: She appears well-developed and well-nourished. No distress.  HENT:  Head: Normocephalic and atraumatic.  Right Ear: Tympanic membrane, external ear and ear canal normal.  Left Ear: Tympanic membrane, external ear and ear canal normal.  Nose: Rhinorrhea present. No mucosal edema, nose lacerations, sinus tenderness, nasal deformity, septal deviation or nasal septal hematoma. No epistaxis.  No foreign bodies. Right sinus exhibits no maxillary sinus tenderness and no frontal sinus tenderness. Left sinus exhibits no maxillary sinus tenderness and no frontal sinus tenderness.  Mouth/Throat: Uvula is midline, oropharynx is clear and  moist and mucous membranes are normal. No oropharyngeal exudate.  Eyes: Conjunctivae and EOM are normal. Pupils are equal, round, and reactive to light. Right eye exhibits no discharge. Left eye exhibits no discharge. No scleral icterus.  Neck: Normal range of motion. Neck supple. No thyromegaly present.  Cardiovascular: Normal rate, regular rhythm and normal heart sounds.   Pulmonary/Chest: Effort normal and breath sounds normal. No respiratory distress. She has no wheezes. She has no rales.  Lymphadenopathy:    She has no cervical adenopathy.  Skin: She is not diaphoretic.  Nursing note and vitals reviewed.    UC Treatments / Results  Labs (all labs ordered are listed, but only abnormal results are displayed) Labs Reviewed  RAPID STREP SCREEN (NOT AT [facility])  CULTURE, GROUP A STREP [facility])    EKG  EKG Interpretation None       Radiology No results found.  Procedures Procedures (including critical care time)  Medications Ordered in UC Medications - No data to display   Initial Impression / Assessment and Plan / UC Course  I have reviewed the triage vital signs and the nursing notes.  Pertinent labs & imaging results that were available during my care of the patient were reviewed by me and considered in my medical decision making (see chart for details).  Clinical Course      Final Clinical Impressions(s) / UC Diagnoses   Final diagnoses:  Viral URI with cough    New Prescriptions Discharge Medication List as of 09/05/2016  9:51 AM     1. Lab results and diagnosis reviewed with patient 2. rx as per orders above; reviewed possible side effects, interactions, risks and benefits  3. Recommend supportive treatment with increased fluids 4. Follow-up prn if symptoms worsen or don't improve   Payton Mccallum, MD 09/05/16 1010

## 2016-09-05 NOTE — ED Triage Notes (Signed)
Patient c/o cough / congestion  

## 2016-09-08 LAB — CULTURE, GROUP A STREP (THRC)

## 2016-11-29 ENCOUNTER — Encounter: Payer: Self-pay | Admitting: Emergency Medicine

## 2016-11-29 ENCOUNTER — Emergency Department
Admission: EM | Admit: 2016-11-29 | Discharge: 2016-11-29 | Disposition: A | Discharge status: Home / Self Care | Payer: Medicaid Other | Attending: Emergency Medicine | Admitting: Emergency Medicine

## 2016-11-29 DIAGNOSIS — N8110 Cystocele, unspecified: Secondary | ICD-10-CM | POA: Diagnosis not present

## 2016-11-29 DIAGNOSIS — F17210 Nicotine dependence, cigarettes, uncomplicated: Secondary | ICD-10-CM | POA: Insufficient documentation

## 2016-11-29 DIAGNOSIS — R102 Pelvic and perineal pain: Secondary | ICD-10-CM | POA: Diagnosis present

## 2016-11-29 HISTORY — DX: Anemia, unspecified: D64.9

## 2016-11-29 LAB — URINALYSIS, COMPLETE (UACMP) WITH MICROSCOPIC
Bilirubin Urine: NEGATIVE
GLUCOSE, UA: NEGATIVE mg/dL
Hgb urine dipstick: NEGATIVE
Ketones, ur: NEGATIVE mg/dL
Leukocytes, UA: NEGATIVE
Nitrite: NEGATIVE
PROTEIN: NEGATIVE mg/dL
SQUAMOUS EPITHELIAL / LPF: NONE SEEN
Specific Gravity, Urine: 1.009 (ref 1.005–1.030)
pH: 6 (ref 5.0–8.0)

## 2016-11-29 LAB — WET PREP, GENITAL
CLUE CELLS WET PREP: NONE SEEN
Sperm: NONE SEEN
TRICH WET PREP: NONE SEEN
YEAST WET PREP: NONE SEEN

## 2016-11-29 LAB — CHLAMYDIA/NGC RT PCR (ARMC ONLY)
Chlamydia Tr: NOT DETECTED
N gonorrhoeae: NOT DETECTED

## 2016-11-29 LAB — POCT PREGNANCY, URINE: PREG TEST UR: NEGATIVE

## 2016-11-29 MED ORDER — TRAMADOL HCL 50 MG PO TABS: 50.0000 mg | ORAL_TABLET | Freq: Four times a day (QID) | ORAL | 0 refills | Status: AC | PRN

## 2016-11-29 MED ORDER — TRAMADOL HCL 50 MG PO TABS
100.0000 mg | ORAL_TABLET | Freq: Once | ORAL | Status: AC
  Administered 2016-11-29: 100 mg via ORAL
  Filled 2016-11-29: qty 2

## 2016-11-29 NOTE — ED Triage Notes (Signed)
Pt to ED c/o lower abd pain and pelvic pain x2 months getting worse.  States pressure and feeling of needing to urinate frequently.  States has had 3 vaginal births, states check vagina at home and felt uterus close to opening of vagina.  Pt denies bleeding, states pain and pressure with urination.

## 2016-11-29 NOTE — Discharge Instructions (Signed)
Please follow-up with your OB/GYN as soon as possible. Return to the emergency department for any increased pain, fever, or any other symptom personally concerning to yourself.

## 2016-11-29 NOTE — ED Notes (Signed)
Pt changing into gown

## 2016-11-29 NOTE — ED Provider Notes (Signed)
[facility] Emergency Department Provider Note  Time seen: 4:38 PM  I have reviewed the triage vital signs and the nursing notes.   HISTORY  Chief Complaint Pelvic Pain and Vaginal Prolapse    HPI Nancy Blankenship is a 26 y.o. female with a past medical history of anemia, 3 vaginal deliveries who presents the emergency department for suspected uterine prolapse. According to the patient for the past 2-3 weeks she has intermittently felt significant pressure in her pelvis. States when she pushes hard to urinate or have a bowel movement she feels as if her uterus is prolapsing out of her vagina. Denies any discharge. Does state mild dysuria/pressure with urination but she is not sure if is a urinary tract infection or prolapse issues. Has not seen an OB/GYN. Denies any discomfort currently.  Past Medical History:  Diagnosis Date  . Anemia     Patient Active Problem List   Diagnosis Date Noted  . Labor and delivery indication for care or intervention 09/23/2015  . Irregular contractions 09/04/2015    History reviewed. No pertinent surgical history.  Prior to Admission medications   Medication Sig Start Date End Date Taking? Authorizing Provider  docusate sodium (COLACE) 100 MG capsule Take 1 capsule (100 mg total) by mouth 2 (two) times daily. 09/25/15   [place] Bing, MD  ferrous sulfate 325 (65 FE) MG tablet Take 1 tablet (325 mg total) by mouth 2 (two) times daily with a meal. 09/25/15   [place] Bing, MD  ibuprofen (ADVIL,MOTRIN) 600 MG tablet Take 1 tablet (600 mg total) by mouth every 6 (six) hours as needed for mild pain or cramping. 09/26/15   [place] Bing, MD  oxyCODONE-acetaminophen (PERCOCET/ROXICET) 5-325 MG tablet Take 1 tablet by mouth every 6 (six) hours as needed for severe pain. 09/25/15   [place] Bing, MD    Allergies  Allergen Reactions  . Codeine Sulfate     vomit  . Nyquil Multi-Symptom [Pseudoeph-Doxylamine-Dm-Apap]      Throat closing  . Pepto-Bismol [Bismuth Subsalicylate]     History reviewed. No pertinent family history.  Social History Social History  Substance Use Topics  . Smoking status: Current Every Day Smoker    Packs/day: 0.50    Types: Cigarettes  . Smokeless tobacco: Never Used  . Alcohol use 1.2 oz/week    2 Glasses of wine per week    Review of Systems Constitutional: Negative for fever. Cardiovascular: Negative for chest pain. Respiratory: Negative for shortness of breath. Gastrointestinal: Negative for abdominal pain. Positive for pelvic pain, intermittent. Genitourinary: Negative for dysuria. Negative for discharge. Negative for bleeding. Negative for hematuria. Neurological: Negative for headache 10-point ROS otherwise negative.  ____________________________________________   PHYSICAL EXAM:  VITAL SIGNS: ED Triage Vitals  Enc Vitals Group     BP 11/29/16 1328 130/79     Pulse Rate 11/29/16 1328 66     Resp 11/29/16 1328 20     Temp 11/29/16 1328 98 F (36.7 C)     Temp Source 11/29/16 1328 Oral     SpO2 11/29/16 1328 95 %     Weight 11/29/16 1329 165 lb (74.8 kg)     Height 11/29/16 1329 5\' 7"  (1.702 m)     Head Circumference --      Peak Flow --      Pain Score 11/29/16 1339 8     Pain Loc --      Pain Edu? --      Excl. in GC? --  Constitutional: Alert and oriented. Well appearing and in no distress. Eyes: Normal exam ENT   Head: Normocephalic and atraumatic.   Mouth/Throat: Mucous membranes are moist. Cardiovascular: Normal rate, regular rhythm. No murmur Respiratory: Normal respiratory effort without tachypnea nor retractions. Breath sounds are clear Gastrointestinal: Soft and nontender. No distention.  Musculoskeletal: Nontender with normal range of motion in all extremities.  Neurologic:  Normal speech and language. No gross focal neurologic deficits Skin:  Skin is warm, dry and intact.  Psychiatric: Mood and affect are normal.    ____________________________________________    INITIAL IMPRESSION / ASSESSMENT AND PLAN / ED COURSE  Pertinent labs & imaging results that were available during my care of the patient were reviewed by me and considered in my medical decision making (see chart for details).  The patient presents the emergency department for intermittent pain/pressure in her pelvis which she believes is due to uterine prolapse. Patient has had 3 vaginal deliveries. States pressure with urination but she is not sure she has a urinary tract infection or if this is due to prolapse. Overall the patient appears very well, completely nontender abdominal exam. Urinalysis is negative, no red cells or white cells. We'll proceed with a pelvic exam. Highly anticipate outpatient OB follow-up.  Pelvic examination shows no abnormalities, mild discharge. Mild diffuse tenderness but no focal adnexal or cervical motion tenderness. Patient states very low risk for STD as she is married with one partner. Patient's wet prep is negative besides moderate WBCs. Urinalysis is negative. We will discharge with GYN follow-up which she has scheduled for this Friday. We'll discharge on Ultram. Patient agreeable plan.  ____________________________________________   FINAL CLINICAL IMPRESSION(S) / ED DIAGNOSES  Pelvic pain/pressure    Minna AntisKevin Rodriquez Thorner, MD 11/29/16 1821

## 2018-11-27 ENCOUNTER — Emergency Department: Payer: BLUE CROSS/BLUE SHIELD

## 2018-11-27 ENCOUNTER — Encounter: Payer: Self-pay | Admitting: Intensive Care

## 2018-11-27 ENCOUNTER — Other Ambulatory Visit: Payer: Self-pay

## 2018-11-27 ENCOUNTER — Emergency Department
Admission: EM | Admit: 2018-11-27 | Discharge: 2018-11-27 | Disposition: A | Discharge status: Home / Self Care | Payer: BLUE CROSS/BLUE SHIELD | Attending: Emergency Medicine | Admitting: Emergency Medicine

## 2018-11-27 DIAGNOSIS — Y929 Unspecified place or not applicable: Secondary | ICD-10-CM | POA: Diagnosis not present

## 2018-11-27 DIAGNOSIS — S62337A Displaced fracture of neck of fifth metacarpal bone, left hand, initial encounter for closed fracture: Secondary | ICD-10-CM | POA: Insufficient documentation

## 2018-11-27 DIAGNOSIS — Y999 Unspecified external cause status: Secondary | ICD-10-CM | POA: Insufficient documentation

## 2018-11-27 DIAGNOSIS — Y939 Activity, unspecified: Secondary | ICD-10-CM | POA: Insufficient documentation

## 2018-11-27 DIAGNOSIS — S6992XA Unspecified injury of left wrist, hand and finger(s), initial encounter: Secondary | ICD-10-CM | POA: Diagnosis present

## 2018-11-27 DIAGNOSIS — W228XXA Striking against or struck by other objects, initial encounter: Secondary | ICD-10-CM | POA: Insufficient documentation

## 2018-11-27 DIAGNOSIS — S62607A Fracture of unspecified phalanx of left little finger, initial encounter for closed fracture: Secondary | ICD-10-CM

## 2018-11-27 DIAGNOSIS — F17210 Nicotine dependence, cigarettes, uncomplicated: Secondary | ICD-10-CM | POA: Insufficient documentation

## 2018-11-27 DIAGNOSIS — Z79899 Other long term (current) drug therapy: Secondary | ICD-10-CM | POA: Insufficient documentation

## 2018-11-27 DIAGNOSIS — S62339A Displaced fracture of neck of unspecified metacarpal bone, initial encounter for closed fracture: Secondary | ICD-10-CM

## 2018-11-27 HISTORY — DX: Bipolar disorder, unspecified: F31.9

## 2018-11-27 MED ORDER — NAPROXEN 500 MG PO TABS
500.0000 mg | ORAL_TABLET | Freq: Once | ORAL | Status: AC
  Administered 2018-11-27: 500 mg via ORAL
  Filled 2018-11-27: qty 1

## 2018-11-27 MED ORDER — NAPROXEN 500 MG PO TABS: 500.0000 mg | ORAL_TABLET | Freq: Two times a day (BID) | ORAL | 0 refills | Status: AC

## 2018-11-27 NOTE — ED Provider Notes (Signed)
[facility] Emergency Department Provider Note ____________________________________________  Time seen: 1609  I have reviewed the triage vital signs and the nursing notes.  HISTORY  Chief Complaint  Hand Pain (left)  HPI Nancy Blankenship is a 28 y.o.left-handed female presents to the ED accompanied by her sister-in-law, for evaluation of left hand pain and disability.  Patient reports that 3 days ago she purposely punched a wall, and sustained pain and swelling to the lateral aspect of her left hand.  Orts pain over the left pinky at the knuckle as well as the proximal phalanx.  No other injuries reported at this time.    Past Medical History:  Diagnosis Date  . Anemia   . Bipolar 1 disorder [facility])     Patient Active Problem List   Diagnosis Date Noted  . Labor and delivery indication for care or intervention 09/23/2015  . Irregular contractions 09/04/2015    History reviewed. No pertinent surgical history.  Prior to Admission medications   Medication Sig Start Date End Date Taking? Authorizing Provider  lamoTRIgine (LAMICTAL) 200 MG tablet Take 200 mg by mouth daily.   Yes [provider]  QUEtiapine (SEROQUEL) 100 MG tablet Take 175 mg by mouth at bedtime.    Yes [provider]  ferrous sulfate 325 (65 FE) MG tablet Take 1 tablet (325 mg total) by mouth 2 (two) times daily with a meal. 09/25/15   [place] Bing, MD  naproxen (NAPROSYN) 500 MG tablet Take 1 tablet (500 mg total) by mouth 2 (two) times daily with a meal for 15 days. 11/27/18 12/12/18  Pardeep Pautz, Charlesetta Ivory, PA-C    Allergies Codeine sulfate; Nyquil multi-symptom [pseudoeph-doxylamine-dm-apap]; and Pepto-bismol [bismuth subsalicylate]  History reviewed. No pertinent family history.  Social History Social History   Tobacco Use  . Smoking status: Current Every Day Smoker    Packs/day: 0.50    Types: Cigarettes  . Smokeless tobacco: Never Used  Substance Use  Topics  . Alcohol use: Yes    Alcohol/week: 2.0 standard drinks    Types: 2 Glasses of wine per week  . Drug use: Yes    Types: Marijuana    Review of Systems  Constitutional: Negative for fever. Cardiovascular: Negative for chest pain. Respiratory: Negative for shortness of breath. Musculoskeletal: Negative for back pain.  Left hand pain as above. Skin: Negative for rash. Neurological: Negative for headaches, focal weakness or numbness. ____________________________________________  PHYSICAL EXAM:  VITAL SIGNS: ED Triage Vitals  Enc Vitals Group     BP 11/27/18 1447 121/74     Pulse Rate 11/27/18 1447 83     Resp 11/27/18 1447 16     Temp 11/27/18 1447 98.2 F (36.8 C)     Temp Source 11/27/18 1447 Oral     SpO2 11/27/18 1447 99 %     Weight 11/27/18 1448 145 lb (65.8 kg)     Height 11/27/18 1448 5\' 7"  (1.702 m)     Head Circumference --      Peak Flow --      Pain Score 11/27/18 1448 5     Pain Loc --      Pain Edu? --      Excl. in GC? --     Constitutional: Alert and oriented. Well appearing and in no distress. Head: Normocephalic and atraumatic. Eyes: Conjunctivae are normal. Normal extraocular movements Cardiovascular: Normal rate, regular rhythm. Normal distal pulses. Respiratory: Normal respiratory effort. No wheezes/rales/rhonchi. Musculoskeletal: left hand without obvious deformity  or dislocation. Mild STS and bruising over the dorsal 3rd MCP. Normal composite fist. Tenderness to palp over the 5th MCP. Nontender with normal range of motion in all extremities.  Neurologic:  Normal gross sensation. Normal speech and language. No gross focal neurologic deficits are appreciated. Skin:  Skin is warm, dry and intact. No rash noted. Psychiatric: Mood and affect are normal. Patient exhibits appropriate insight and judgment. ____________________________________________   RADIOLOGY  Left Hand  Comminuted fracture to the 5th MC neck Non-displaced fracture of the  proximal 5th phalanx ____________________________________________  PROCEDURES  .Splint Application Date/Time: 11/27/2018 5:13 PM Performed by: Lissa HoardMenshew, Drema Eddington V Bacon, PA-C Authorized by: Rosalio LoudJoyce, Tammy J   Consent:    Consent obtained:  Verbal   Consent given by:  Patient   Risks discussed:  Pain   Alternatives discussed:  No treatment Pre-procedure details:    Sensation:  Normal Procedure details:    Laterality:  Left   Location:  Hand   Hand:  L hand   Splint type:  Ulnar gutter   Supplies:  Elastic bandage, Ortho-Glass and cotton padding Post-procedure details:    Pain:  Improved   Sensation:  Normal   Patient tolerance of procedure:  Tolerated well, no immediate complications  ____________________________________________  INITIAL IMPRESSION / ASSESSMENT AND PLAN / ED COURSE  ED evaluation and initial fracture management of a closed fifth metacarpal fracture as well as a proximal phalanx fracture.  Patient is placed in appropriate ulnar gutter splint for protection.  She is referred to orthopedics for ongoing fracture management. ____________________________________________  FINAL CLINICAL IMPRESSION(S) / ED DIAGNOSES  Final diagnoses:  Closed boxer's fracture, initial encounter  Fracture of unspecified phalanx of left little finger, initial encounter for closed fracture      Karmen StabsMenshew, Charlesetta IvoryJenise V Bacon, PA-C 11/27/18 1716    Arnaldo NatalMalinda, Paul F, MD 11/28/18 [phone]

## 2018-11-27 NOTE — ED Triage Notes (Signed)
Patient c/o Left hand pain after punching wood  Board. Bruising noted

## 2018-11-27 NOTE — ED Notes (Signed)
See triage note  Presents with left hand pain  States she punched a wall

## 2018-11-27 NOTE — Discharge Instructions (Signed)
Your exam and x-ray shows a fracture to the knuckle and proximal of the pinky. Wear the finger splint for protection. Apply ice to reduce pain and swelling. Follow-up with Dr. Martha Clan for further fracture management.

## 2019-06-27 IMAGING — CR DG HAND COMPLETE 3+V*L*
3 series · 3 of 3 positions shown · non-contrast
Comparison: None.

CLINICAL DATA: Left hand pain after punching would

EXAM:
LEFT HAND - COMPLETE 3+ VIEW

[hand ap]
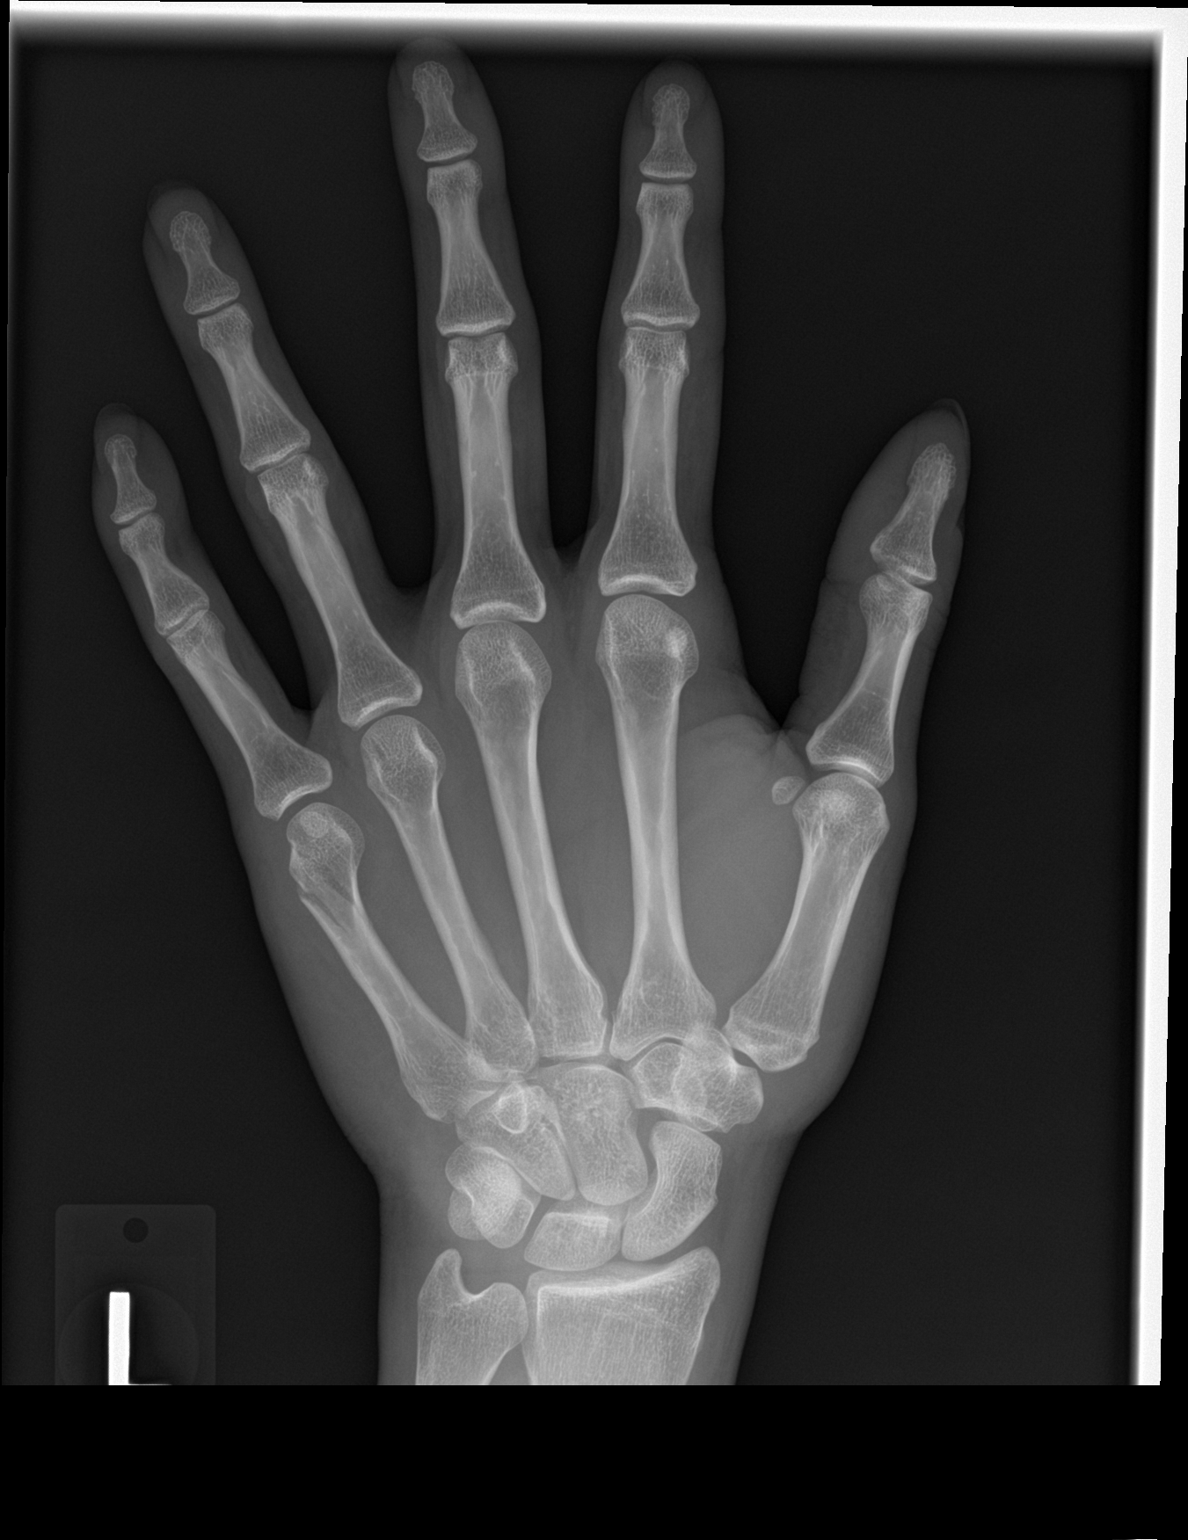

[hand obl]
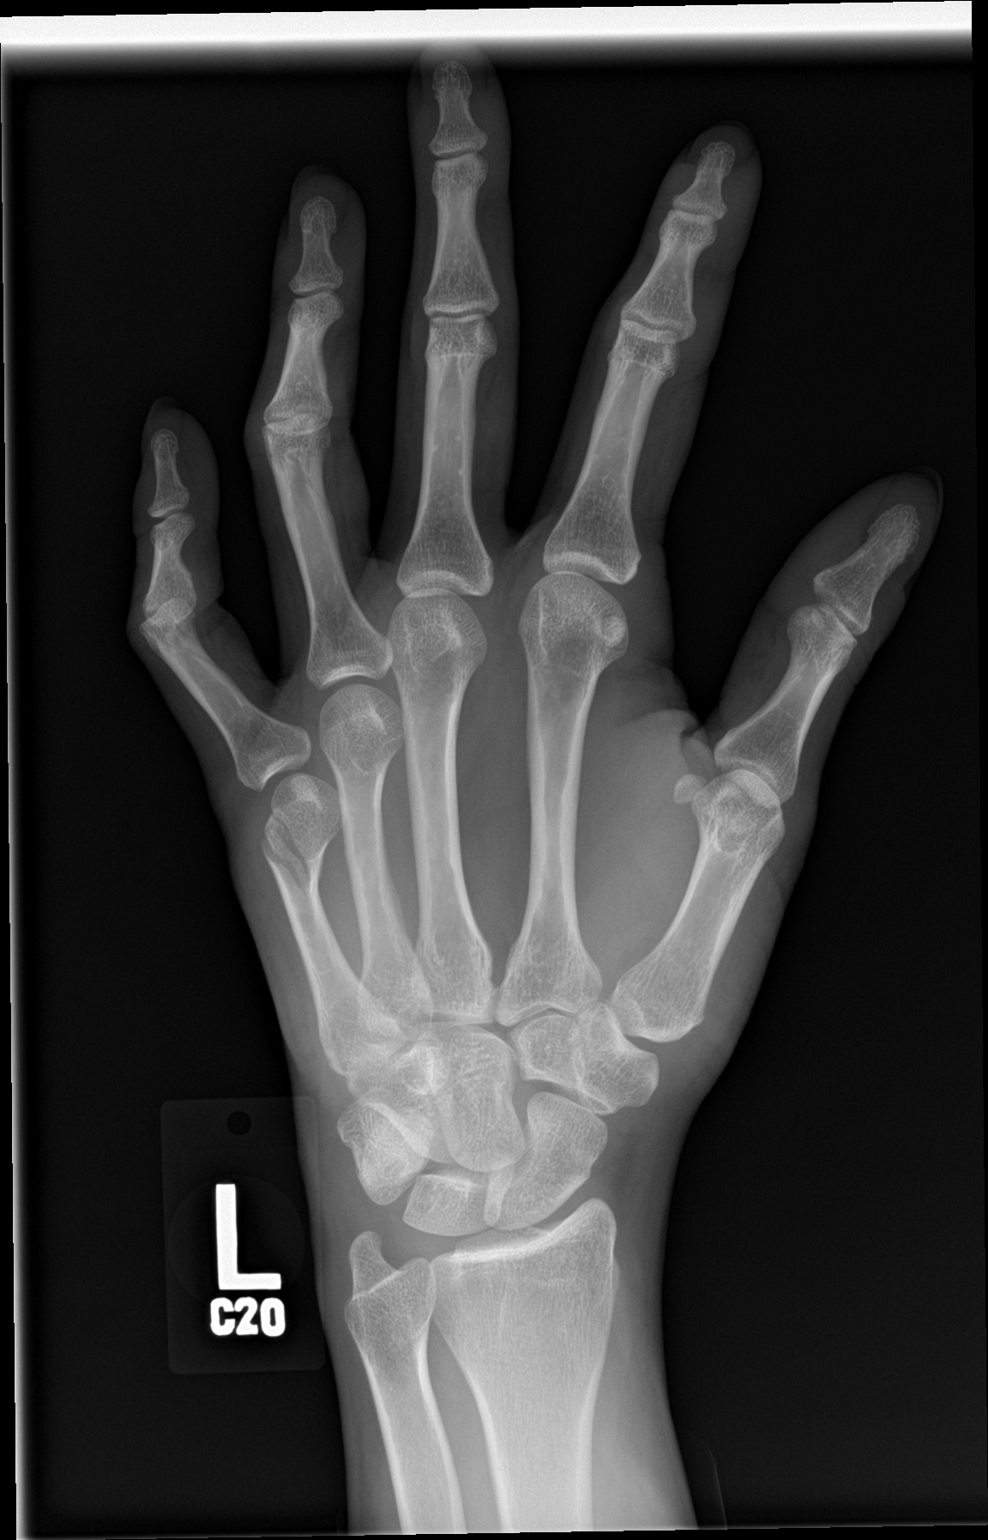

[hand lat]
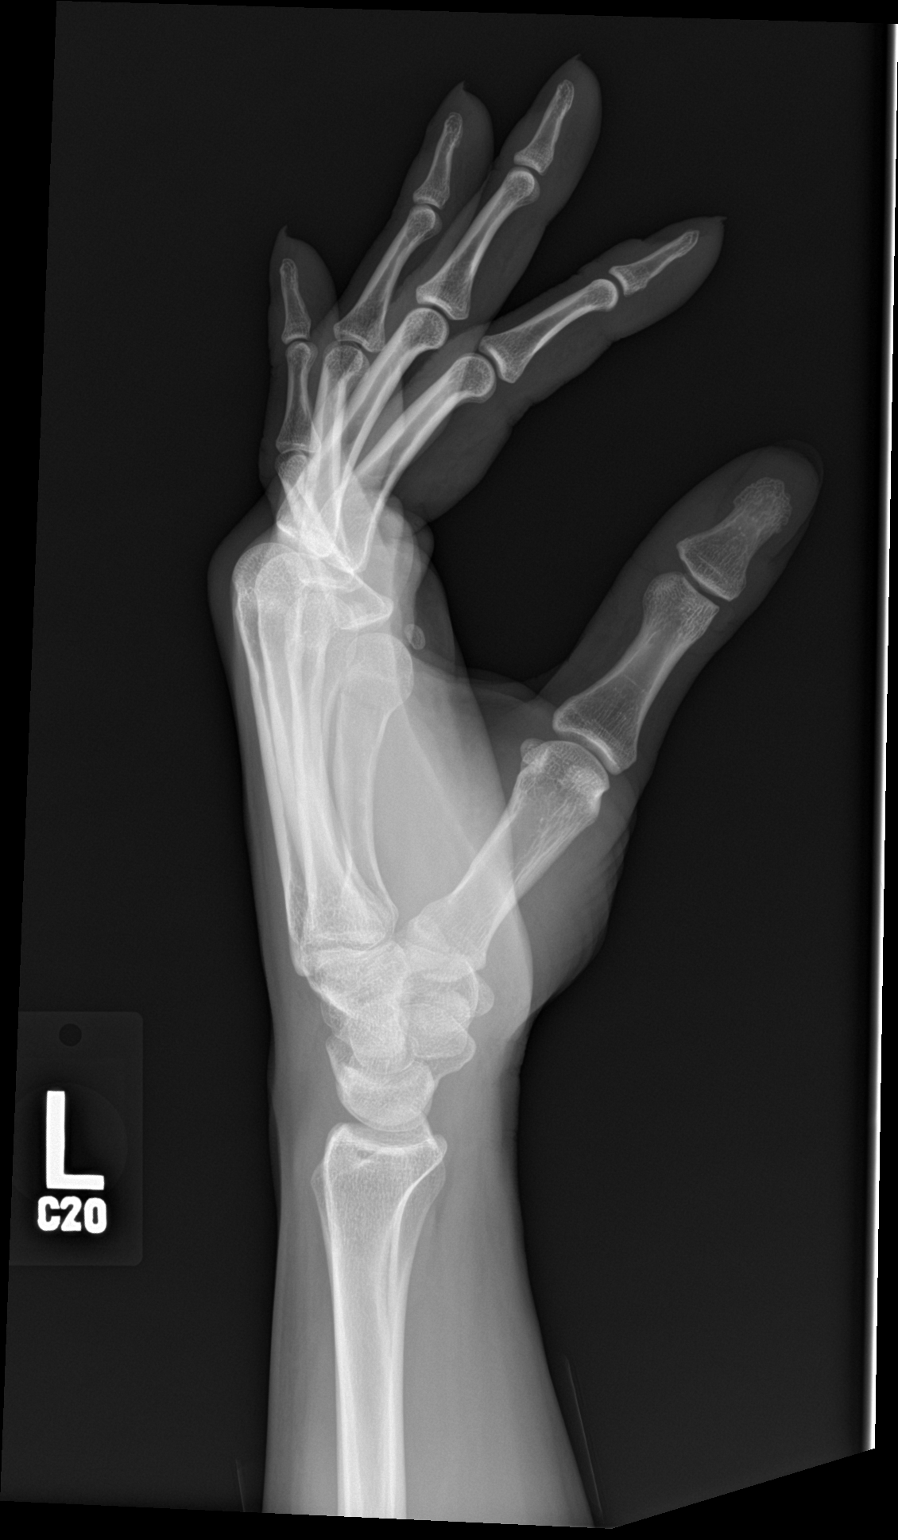

[3 of 3 positions shown; findings below may reference images not displayed]

FINDINGS: Mildly comminuted fracture of the left fifth metacarpal neck without
displacement or angulation. No other fracture or dislocation. No
aggressive osseous lesion.
IMPRESSION: Mildly comminuted fracture of the left fifth metacarpal neck without
displacement or angulation.
